# Patient Record
Sex: Male | Born: 1935 | Race: White | Hispanic: No | Marital: Married
Health system: Southern US, Community
[De-identification: ages and names within clinical notes are randomized; demographics above are authoritative.]

---

## 1997-08-10 ENCOUNTER — Emergency Department (HOSPITAL_COMMUNITY): Admission: EM | Admit: 1997-08-10 | Discharge: 1997-08-10 | Payer: Self-pay | Admitting: Emergency Medicine

## 1997-09-01 ENCOUNTER — Ambulatory Visit (HOSPITAL_COMMUNITY): Admission: RE | Admit: 1997-09-01 | Discharge: 1997-09-01 | Payer: Self-pay | Admitting: Pulmonary Disease

## 1997-09-28 ENCOUNTER — Ambulatory Visit (HOSPITAL_COMMUNITY): Admission: RE | Admit: 1997-09-28 | Discharge: 1997-09-28 | Payer: Self-pay | Admitting: Internal Medicine

## 1997-11-09 ENCOUNTER — Inpatient Hospital Stay (HOSPITAL_COMMUNITY): Admission: EM | Admit: 1997-11-09 | Discharge: 1997-11-19 | Payer: Self-pay | Admitting: Emergency Medicine

## 1997-11-10 ENCOUNTER — Encounter: Payer: Self-pay | Admitting: Emergency Medicine

## 1997-11-11 ENCOUNTER — Encounter: Payer: Self-pay | Admitting: Emergency Medicine

## 1997-11-15 ENCOUNTER — Encounter: Payer: Self-pay | Admitting: Emergency Medicine

## 1997-11-22 ENCOUNTER — Inpatient Hospital Stay (HOSPITAL_COMMUNITY): Admission: EM | Admit: 1997-11-22 | Discharge: 1997-11-24 | Payer: Self-pay | Admitting: Emergency Medicine

## 1997-11-30 ENCOUNTER — Encounter: Admission: RE | Admit: 1997-11-30 | Discharge: 1998-02-28 | Payer: Self-pay | Admitting: Radiation Oncology

## 1998-01-30 ENCOUNTER — Encounter: Payer: Self-pay | Admitting: Hematology & Oncology

## 1998-01-30 ENCOUNTER — Ambulatory Visit (HOSPITAL_COMMUNITY): Admission: RE | Admit: 1998-01-30 | Discharge: 1998-01-30 | Payer: Self-pay | Admitting: Hematology & Oncology

## 1998-02-20 ENCOUNTER — Encounter: Payer: Self-pay | Admitting: Thoracic Surgery

## 1998-02-20 ENCOUNTER — Inpatient Hospital Stay: Admission: RE | Admit: 1998-02-20 | Discharge: 1998-02-28 | Payer: Self-pay | Admitting: Thoracic Surgery

## 1998-02-21 ENCOUNTER — Encounter: Payer: Self-pay | Admitting: Thoracic Surgery

## 1998-02-22 ENCOUNTER — Encounter: Payer: Self-pay | Admitting: Thoracic Surgery

## 1998-02-23 ENCOUNTER — Encounter: Payer: Self-pay | Admitting: Thoracic Surgery

## 1998-02-24 ENCOUNTER — Encounter: Payer: Self-pay | Admitting: Thoracic Surgery

## 1998-02-25 ENCOUNTER — Encounter: Payer: Self-pay | Admitting: Thoracic Surgery

## 1998-02-26 ENCOUNTER — Encounter: Payer: Self-pay | Admitting: Thoracic Surgery

## 1998-02-27 ENCOUNTER — Encounter: Payer: Self-pay | Admitting: Thoracic Surgery

## 1998-05-02 ENCOUNTER — Encounter: Payer: Self-pay | Admitting: Thoracic Surgery

## 1998-05-04 ENCOUNTER — Ambulatory Visit (HOSPITAL_COMMUNITY): Admission: RE | Admit: 1998-05-04 | Discharge: 1998-05-04 | Payer: Self-pay | Admitting: Thoracic Surgery

## 1998-07-03 ENCOUNTER — Encounter: Admission: RE | Admit: 1998-07-03 | Discharge: 1998-10-01 | Payer: Self-pay | Admitting: Anesthesiology

## 1998-10-18 ENCOUNTER — Ambulatory Visit (HOSPITAL_COMMUNITY): Admission: RE | Admit: 1998-10-18 | Discharge: 1998-10-18 | Payer: Self-pay

## 1998-12-01 ENCOUNTER — Encounter: Admission: RE | Admit: 1998-12-01 | Discharge: 1999-02-27 | Payer: Self-pay | Admitting: Anesthesiology

## 1999-02-27 ENCOUNTER — Encounter: Admission: RE | Admit: 1999-02-27 | Discharge: 1999-05-23 | Payer: Self-pay | Admitting: Anesthesiology

## 1999-08-21 ENCOUNTER — Encounter: Admission: RE | Admit: 1999-08-21 | Discharge: 1999-11-19 | Payer: Self-pay | Admitting: Anesthesiology

## 1999-11-21 ENCOUNTER — Encounter: Admission: RE | Admit: 1999-11-21 | Discharge: 2000-02-19 | Payer: Self-pay | Admitting: Anesthesiology

## 2000-04-16 ENCOUNTER — Ambulatory Visit (HOSPITAL_COMMUNITY): Admission: RE | Admit: 2000-04-16 | Discharge: 2000-04-16 | Payer: Self-pay

## 2000-05-03 ENCOUNTER — Ambulatory Visit (HOSPITAL_COMMUNITY): Admission: RE | Admit: 2000-05-03 | Discharge: 2000-05-03 | Payer: Self-pay

## 2000-05-08 ENCOUNTER — Encounter: Payer: Self-pay | Admitting: Thoracic Surgery

## 2000-05-08 ENCOUNTER — Inpatient Hospital Stay (HOSPITAL_COMMUNITY): Admission: EM | Admit: 2000-05-08 | Discharge: 2000-05-13 | Payer: Self-pay | Admitting: Emergency Medicine

## 2000-05-12 ENCOUNTER — Encounter: Payer: Self-pay | Admitting: Pulmonary Disease

## 2000-05-13 ENCOUNTER — Inpatient Hospital Stay: Admission: RE | Admit: 2000-05-13 | Discharge: 2000-05-16 | Payer: Self-pay | Admitting: Pulmonary Disease

## 2001-09-14 ENCOUNTER — Ambulatory Visit (HOSPITAL_COMMUNITY): Admission: RE | Admit: 2001-09-14 | Discharge: 2001-09-14 | Payer: Self-pay

## 2001-09-21 ENCOUNTER — Ambulatory Visit (HOSPITAL_COMMUNITY): Admission: RE | Admit: 2001-09-21 | Discharge: 2001-09-21 | Payer: Self-pay

## 2001-10-05 ENCOUNTER — Ambulatory Visit (HOSPITAL_COMMUNITY): Admission: RE | Admit: 2001-10-05 | Discharge: 2001-10-05 | Payer: Self-pay

## 2002-03-14 ENCOUNTER — Encounter: Payer: Self-pay | Admitting: Emergency Medicine

## 2002-03-15 ENCOUNTER — Inpatient Hospital Stay (HOSPITAL_COMMUNITY): Admission: EM | Admit: 2002-03-15 | Discharge: 2002-03-22 | Payer: Self-pay | Admitting: Emergency Medicine

## 2002-03-17 ENCOUNTER — Encounter: Payer: Self-pay | Admitting: Internal Medicine

## 2002-03-18 ENCOUNTER — Encounter: Payer: Self-pay | Admitting: Internal Medicine

## 2002-03-21 ENCOUNTER — Encounter: Payer: Self-pay | Admitting: Internal Medicine

## 2009-02-22 ENCOUNTER — Ambulatory Visit: Payer: Self-pay | Admitting: Vascular Surgery

## 2010-07-31 NOTE — Procedures (Signed)
CAROTID DUPLEX EXAM   INDICATION:  Right CCA/innominate stenosis.   HISTORY:  Diabetes:  Borderline.  Cardiac:  No.  Hypertension:  No.  Smoking:  Yes.  Previous Surgery:  No.  CV History:  Asymptomatic.  Amaurosis Fugax No, Paresthesias No, Hemiparesis No.                                       RIGHT             LEFT  Brachial systolic pressure:         60                70  Brachial Doppler waveforms:         Monophasic        Monophasic  Vertebral direction of flow:        Not visualized    Antegrade  DUPLEX VELOCITIES (cm/sec)  CCA peak systolic                   P = 195, M = 47   P = 250, M = 116  ECA peak systolic                   51                143  ICA peak systolic                   46                102  ICA end diastolic                   32                34  PLAQUE MORPHOLOGY:  PLAQUE AMOUNT:  PLAQUE LOCATION:   IMPRESSION:  1. Bilateral internal carotid arteries appear within normal limits      with no plaque visualized within them.  Right velocities appear      abnormally low.  2. Elevated velocities in the bilateral common carotid artery origins.  3. Elevated velocities of 379 cm/s in the innominate artery on the      right.  4. Right vertebral artery not visualized.  Left is antegrade.  5. Bilateral monophasic brachial Dopplers with low pressures.   ___________________________________________  Larina Earthly, M.D.   AS/MEDQ  D:  02/22/2009  T:  02/22/2009  Job:  [id number]

## 2010-07-31 NOTE — Consult Note (Signed)
NEW PATIENT CONSULTATION   Adam Figueroa, Adam Figueroa  DOB:  December 10, 1932                                       02/22/2009  ZOXWR#:[id number]   The patient presents today for evaluation of extracranial  cerebrovascular occlusive disease.  He is a very pleasant 77 year old  gentleman who was evaluated with an outside ultrasound for low blood  pressure in both arms.  This suggested potential innominate stenosis and  we are seeing him for further evaluation.  The patient denies any  neurologic deficits.  He specifically denies any history of amaurosis  fugax, transient ischemic attack or stroke.  He denies any posterior  symptoms, specifically any dizziness or blackouts.  He does have a past  history of smoking and actually underwent a resection of a malignancy  lung cancer by Dr. Dewayne Shorter 11 years ago.  He has been cancer free  since that time.  He also has significant spinal stenosis.  He does  report positional numbness in his fourth and fifth fingers bilaterally  that occur with certain positions.  He does have history of prior  thoracic spine surgery as well.  He denies hypertension, elevated  cholesterol.  He has never had any cardiac difficulty and denies COPD or  emphysema.   FAMILY HISTORY:  Is negative for premature atherosclerotic disease.   SOCIAL HISTORY:  He is married with 1 child.  He is retired.  He does  continue to smoke less than 1 pack of cigarettes per day.  He does not  drink alcohol on a regular basis.   REVIEW OF SYSTEMS:  He denies any weight loss or weight gain.  His  weight is reported at 148 pounds.  He is 5 feet 9 inches tall.  PULMONARY:  He does have shortness of breath with exertion.  He is on  home oxygen from a pulmonary standpoint.  CARDIAC:  He denies any other cardiac difficulties.  GI:  Positive for reflux.  GU:  Negative for urinary frequency.  VASCULAR:  Negative for claudication type symptoms.  NEUROLOGIC:  He denies dizziness,  blackouts, headaches, seizure.  MUSCULOSKELETAL:  Positive for arthritis.  HEENT:  Positive for occasional sore throat.  PSYCHIATRIC:  Negative.  HEMATOLOGIC:  Negative.  SKIN:  Negative.   PHYSICAL EXAM:  General:  He is a well-developed, thin white male  appearing his stated age.  He is in no distress.  Vital signs:  His  blood pressure is systolic at 60 in the left arm, not obtainable in the  right arm.  His heart rate is 60, respirations 16.  HEENT:  Pupils  equal, round and reactive to light.  Extraocular movements are intact.  Neck:  No cervical lymphadenopathy.  No bruits are present.  Lungs:  Are  clear bilaterally.  Does have a well-healed left thoracotomy incision.  Cardiovascular:  Heart is regular rate and rhythm.  Abdomen:  Soft,  nontender.  I do not hear any bruits.  He does not have any masses  present.  Musculoskeletal:  No deformities or cyanosis.  Neuro:  No  focal weakness or paresthesias.  Skin:  Without ulcers or rashes.  He  does have barely palpable radial pulses bilaterally.  He has 2+ femoral  pulses, absent popliteal and distal pulses.   He underwent repeat carotid duplex in our office and this does appear to  show evidence of stenosis in his innominate artery.  His right vertebral  artery is not visualized.  He does have antegrade flow in his left  vertebral artery.  His brachial pressures are 60 bilaterally with  monophasic waveforms.  His carotids showed no evidence of elevated  velocities but he does have proximal stenoses.  I discussed this at  length with the patient and his wife present and also by son-in-law by  telephone.  I explained to them that I am not able to find any symptoms  related to his extensive supra-aortic trunk stenoses with his left  subclavian and innominate artery stenoses.  I discussed options of  further evaluation with CT scan or arteriogram but in light of no  symptoms would not recommend any treatment based on these.  I  explained  there is no role for asymptomatic treatment of these areas of stenosis.  I did explain the symptoms of focal neurologic deficits or global  posterior circulation ischemia and he will notify us should this occur.  I did explain that it would be essentially impossible to know what an  accurate blood pressure is.  I certainly would not recommend any  invasive treatment simply to be able to obtain blood pressure  measurements.  They understand and will notify us should he develop any  new difficulties.  We will see him again on an as-needed basis.   Larina Earthly, M.D.  Electronically Signed   TFE/MEDQ  D:  02/22/2009  T:  02/23/2009  Job:  [id number]   cc:   Kari Baars, M.D.

## 2010-08-03 NOTE — Discharge Summary (Signed)
NAME:  Adam Figueroa, Adam Figueroa                        ACCOUNT NO.:  [id number]   MEDICAL RECORD NO.:  [id number]                   PATIENT TYPE:  INP   LOCATION:  4713                                 FACILITY:  [facility]   PHYSICIAN:  Catalina Pizza, M.D.                     DATE OF BIRTH:  1932/08/24   DATE OF ADMISSION:  03/14/2002  DATE OF DISCHARGE:  03/22/2002                                 DISCHARGE SUMMARY   DISCHARGE DIAGNOSES:  1. Left lower lobe pneumonia.  2. Depression.  3. Chronic pain.  4. Chronic obstructive pulmonary disease.  5. Oral candidiasis.  6. Hypokalemia.   DISCHARGE MEDICATIONS:  1. Neurontin 300 mg p.o. b.i.d.  2. Zoloft 100 mg p.o. daily.  3. Tequin 400 mg p.o. daily x8 days.  4. Prednisone 60 mg for three days, 40 mg for three days, 20 mg for three     days.  5. Clotrimazole Troche 4-5 times daily for 14 days.  6. Tussionex 5 mL q.12h. p.r.n. for cough.  7. Albuterol MDI 2 puffs q.6h. p.r.n. shortness of breath.  8. Atrovent MDI 2 puffs b.i.d. x2 weeks.  9. Humibid LA 1200 mg p.o. b.i.d. x2 weeks.   CHIEF COMPLAINT:  Shortness of breath.   HISTORY OF PRESENT ILLNESS:  A 77 year old white male with history of left  upper lobe lung resection secondary to non-small cell cancer with history of  COPD, former smoker, presented to the emergency department with one-week  history of cough with sputum, yellow to greenish to brownish.  Positive for  myalgias, fevers, decreased appetite, rhinorrhea, and sinus tenderness.  Symptoms have gradually worsened up until day of admission with increased  shortness of breath.  The patient denies any chest pain but admits to muscle  soreness in the abdomen and chest and episodic vomiting following cough  spells.   PHYSICAL EXAMINATION:  VITAL SIGNS:  Upon admission:  Blood pressure 106/52,  pulse 106, temperature 97, respirations 25, O2 saturation 87% on room air  and 93% on 2 L.  GENERAL:  This is a male in mild distress,  unable to complete sentences  with complete breath.  HEENT:  Eyes:  Pupils equal, round, and reactive to light and accommodation.  Oropharynx was red and erythematous.  No exudates.  Positive sinus  tenderness.  NECK:  Supple, no JVD, no lymphadenopathy.  RESPIRATIONS:  Decreased breath sounds on the left, positive coarse breath  sounds bibasilar, positive crackles anteriorly left lung field and  tachypnea.  CARDIOVASCULAR:  Tachycardic but regular rhythm.  GASTROINTESTINAL:  Soft and nondistended.  Positive diffuse tenderness.  No  guarding.  Positive bowel sounds.  No masses appreciated.  EXTREMITIES:  No edema, cyanosis, or clubbing.  NEUROLOGIC:  Cranial nerves II-XII intact.  There were no focal deficits.   LABORATORY DATA:  Labs obtained during hospitalization:  ABG on 3 L had a pH  of  7.33, a PCO2 of 40.6, PO2 of 69, bicarb 22, O2 saturation of 92.  CBC  upon discharge:  White blood cells 13.1, hemoglobin 14.5, hematocrit 42.7,  MCV of 84.2, platelets of 332.  A BMP upon discharge of sodium 139,  potassium 3.9, chloride 102, CO2 26, glucose 134, BUN 14, creatinine 0.8,  calcium 9.  AST of 36, ALT of 28, alkaline phosphatase 75, total bilirubin  1.3.  Blood cultures x2 were negative.  Sputum culture:  Moderate yeast  noted.  C. difficile toxin was negative.  Legionella was negative.  Influenza type A and influenza type B negative.   Studies obtained during hospitalization:  Chest x-ray upon admission showed  extensive scarring, a change in the left lung, but no new increased density  in the left lower lung but suspicious for infiltrate.   HOSPITAL COURSE:  Problem 1:  COMMUNITY-ACQUIRED PNEUMONIA:  The patient  apparently had left lower lobe pneumonia upon admission and was started on  Zithromax 500 mg and Rocephin 1 g per day and received this for four days  until switched to Tequin 400 mg p.o. on March 20, 2002.  During his  hospitalization, the patient continued to have  troubles with his breathing  secondary to his COPD and previous history.  Consulted Dr. Shan Levans,  who suggested to continue on antibiotics but as well as treat COPD with IV  steroids and inhalers and nebulizer treatment.  Upon discharge, the patient  was to receive a 10-day total course of Tequin 400 mg daily.   Problem 2:  CHRONIC OBSTRUCTIVE PULMONARY DISEASE:  During hospitalization,  the patient was treated with nebulizer treatment, as well as p.o. and IV  steroids and sent out on a prednisone taper over nine days; 60 mg, 40 mg, 20  mg three days each.  Also sent out with albuterol and Atrovent inhalers, as  well as Humibid 1200 mg b.i.d.  Also control the cough with Tussionex.   Problem 3:  ORAL CANDIDIASIS:  The patient was started on clotrimazole  troches 4-5 times per day for 14 days secondary to continual nebulizer  treatment and steroids.   Problem 4:  DEPRESSION:  The patient is to continue on Zoloft 100 mg p.o.  daily.   Problem 5:  CHRONIC PAIN:  The patient is sent out on Neurontin 300 mg p.o.  b.i.d., which was his at home dose.   DISPOSITION:  The patient has no restrictions.  He is to return to the  emergency department if he has increase in fever greater than 101.3 or  having worsening breathing.  He is to follow up with his family doctor if he  has any worsening problems with his breathing.  The patient is encouraged to  get a pulmonary doctor to follow, given his significant lung history.  He is  set up to get home O2 at 2 L for the next three weeks as needed for his  shortness of breath and he is to follow up with his family doctor for  further evaluation and need for home oxygen.                                               Catalina Pizza, M.D.    ZH/MEDQ  D:  05/12/2002  T:  05/13/2002  Job:  [id number]

## 2010-08-03 NOTE — Discharge Summary (Signed)
[place]  Patient:    Adam Figueroa, Adam Figueroa                     MRN: [id number] Adm. Date:  [id number] Disc. Date: 05/13/00 Attending:  Merwyn Katos Dictator:   Earley Favor, RN, MSN, ACNP CC:         Norton Blizzard, M.D.  Loma Sender, M.D.   Discharge Summary  DATE OF BIRTH:  08/12/1932  DISCHARGE DIAGNOSES: 1. Community acquired pneumonia, no organ specified. 2. 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 3. Depression. 4. Failure to thrive. 5. Debilitation.  HISTORY OF PRESENT ILLNESS:  Mr. Mccleery is an elderly white male patient of Dr. Edwyna Shell at [facility] who performed a left upper lobe wedge resection for a hilar mass nonsmall cell cancer with radiation therapy and chemotherapy December 1999.  He is followed by Dr. Sung Amabile of pulmonary. ______ to [facility] in the emergency room with a three week history of weight loss, cough that was nonproductive, dry heaves, postnasal drip, increasing shortness of breath that was over his normal pulmonary baseline. Recent chest CT ordered by Dr. Edwyna Shell demonstrated a left upper lobe questionable pneumonia and changes questionably consistent with radiation pneumonitis.  Due to ataxia he was admitted for further evaluation and treatment along with a weight loss of over 20 pounds over the last three weeks.  LABORATORIES:  CEA 1.0.  TSH 1.339.  Sodium 138, potassium 4.2, chloride 104, CO2 27, glucose 143, BUN 8, creatinine 0.8, calcium 8.4.  Hemoglobin 12.7, hematocrit 38.3, WBC 14.1, platelets 421,000.  PT 15.7, INR 1.4, PTT 38.  ALT 44, ALP 47, total bilirubin 0.7.  Chest x-ray shows postoperative changes, slight improvement of aeration left mid and upper lung zones.  HOSPITAL COURSE: #1 - COMMUNITY ACQUIRED PNEUMONIA, NO ORGAN SPECIFIED:  Mr. Fussell is currently on day five antimicrobial therapy.  He  will be continued on p.o. Tequin for 10 full days for treatment.  No organ was specified with this pneumonia.  Radiographic changes were consistent with left upper lobe pneumonia.  #2 - STATUS POST RESECTION OF LEFT UPPER LOBE WEDGE RESECTION FOR NONSMALL CELL CARCINOMA:  No changes in that area.  #3 - DEPRESSION:  Currently depression is being treated with Zoloft.  He has had a weight loss of 20 pounds over the last three weeks.  Family reports that he is not motivated to perform activities of daily living.  Due to his depression he will be transferred to subacute care unit for further evaluation and treatment.  #4 - FAILURE TO THRIVE, DEBILITATION:  Due to his recent weight loss over the last three weeks of 20 pounds coupled with his new no organ specific community acquired pneumonia coupled again with his depression, he will be transferred to subacute care unit for strengthening, rehabilitation, and continued medication to treat his depression.  MEDICATIONS:  1. Albuterol 2.5 hand-held nebulizer q.6h.  2. Atrovent 0.5 mg hand-held nebulizer q.6h.  3. Zoloft 25 mg q.d.  4. Protonix 40 mg q.d.  5. Neurontin 300 mg b.i.d.  6. Tequin 400 mg q.d. for five days.  7. Ambien 10 mg q.h.s. p.r.n. dispensed as written.  8. Laxative of choice.  9. Tylenol 650 mg q.6h. p.r.n. for discomfort. 10. Sodium chloride nasal spray two puffs q.i.d. p.r.n. for nasal dryness.  DIET:  Regular diet with chocolate Ensure.  Of note, he  refused barium swallow study.  CONDITION ON DISCHARGE:  His community acquired pneumonia has responded to antimicrobial interventions.  His depression remains profound with a dull affect.  Failure to thrive and debilitation secondary to acute and chronic illness being address will transfer to subacute care unit.  His pulmonary status improved.  His depression and debilitation remained worse. DD:  05/13/00 TD:  05/13/00 Job: [id number]

## 2010-08-03 NOTE — Consult Note (Signed)
[facility]  Patient:    Adam Figueroa, Adam Figueroa                     MRN: [id number] Adm. Date:  [id number] Attending:  Thyra Breed CC:         Loma Sender, M.D.   Consultation Report  FOLLOWUP EVALUATION:  The patient comes in for followup evaluation of his brachioplexopathy.  Since his previous evaluation, the patient has been relatively stable on his current regimen.  He has minimal pain at 2/10 today. He does have difficulties with sleep; he sleeps for about two hours every night and then cannot get back to sleep.  CURRENT MEDICATIONS 1. Neurontin 400 mg b.i.d. 2. Methadone 5 mg per day. 3. Atrovent. 4. Flovent. 5. Flonase.  EXAMINATION  VITAL SIGNS:  Blood pressure is 139/54.  Heart rate is 67.  Respiratory rate is 16.  O2 saturation is 95%.  Pain level is 2/10.  CHEST:  Lungs demonstrate rare wheezes.  He has some tenderness over the distal aspect of his left thoracotomy scar.  IMPRESSION 1. Brachioplexopathy -- improved. 2. Underlying chronic obstructive pulmonary disease, per Dr. Loma Sender.  DISPOSITION 1. Reduce methadone to 2.5 mg per day if tolerated; if not, go back to 5 mg    per day. 2. Continue on Neurontin 400 mg b.i.d., #60 with 5 refills. 3. Trial of Elavil 10 mg one p.o. q.h.s., #30 with 5 refills. 4. Follow up with me in three months. DD:  11/21/99 TD:  11/21/99 Job: [id number]

## 2010-08-03 NOTE — Consult Note (Signed)
NAME:  Adam Figueroa, Adam Figueroa NO.:  [id number]   MEDICAL RECORD NO.:  [id number]                   PATIENT TYPE:  INP   LOCATION:  4713                                 FACILITY:  [facility]   PHYSICIAN:  Shan Levans, M.D. [facility]            DATE OF BIRTH:  Oct 19, 1932   DATE OF CONSULTATION:  03/19/2002  DATE OF DISCHARGE:                                   CONSULTATION   CHIEF COMPLAINT:  Hypoxemia.   HISTORY OF PRESENT ILLNESS:  This is a 77 year old white male, history of  previous left upper lobectomy for non-small-cell carcinoma of the lung in  1999.  He had received preop radiation therapy and chemotherapy for this  procedure, also has severe COPD, is admitted now on March 15, 2002 to  Medical Teaching Service with hypoxemia and left lung pneumonia.  The x-ray  has shown progression to the right lung; he is on Rocephin and Zithromax.  The patient's symptomatology has waxed and waned, he is still remaining  hypoxic, having difficulty raising secretions.   PAST MEDICAL HISTORY:  Medical:  History of non-small-cell carcinoma of the  lung with resection of the left upper lobe in 1999, history of chronic  obstructive pulmonary disease, history of depression.   ALLERGIES:  None.   SOCIAL HISTORY:  The patient is an ex-smoker; he claims he is not actively  smoking at this time.   SOCIAL HISTORY:  The patient is married.   FAMILY HISTORY:  Noncontributory.   PHYSICAL EXAMINATION:  VITAL SIGNS:  Temperature 98, blood pressure 132/66,  pulse 89, saturation 90% on 4 L.  GENERAL:  This is an ill-appearing, thin, cachectic white male in no acute  distress.  CHEST:  Chest showed distant breath sounds, bilateral rhonchi, expiratory  wheezes.  CARDIAC:  Exam showed a regular rate and rhythm without S3, normal S1 and  S2.  ABDOMEN:  Abdomen was scaphoid, nontender, bowel sounds active.  EXTREMITIES:  Extremities showed no edema or clubbing.  NEUROLOGIC:  Exam  was intact.  HEENT/NECK:  Exam shows no jugular venous distention, no lymphadenopathy,  oropharynx clear, neck supple.   LABORATORY DATA:  Chest x-ray shows patchy infiltrate, right upper lung zone  and left lower lobe.   Sputum culture is no growth to date.  Legionella urine antigen is negative.  Blood cultures are negative to date.  Sodium 142, potassium 3.3, chloride 106, CO2 28, BUN 9, creatinine 0.6,  blood sugar 150.  White count 16.5, hemoglobin 13.0.   IMPRESSION AND RECOMMENDATION:  The patient appears to have community-  acquired pneumonia, both left lower lobe and right upper lobe, slow to  respond to Rocephin and Zithromax, also has severe chronic obstructive  pulmonary disease, on prednisone, nebulizer and oxygen therapy, previous  left upper lobectomy for lung cancer in 1999 with preoperative radiation  therapy and probably some residual lung injury to the left lower lobe due to  radiation  therapy, hypoxemia, increased work of breathing and mucus plugging  and insomnia.   Recommendations:  Would change back to intravenous Solu-Medrol 80 mg  intravenously q.12h. for increased airway inflammation, would continue  Rocephin and Zithromax as are, would consider Ambien 10 mg h.s. for sleep  for the insomnia and continue nebulizer treatments and add Humibid LA two  b.i.d. to the patient's care.  We will have Dr. Onalee Hua B. Simonds follow up  the patient as well with you.                                               Shan Levans, M.D. [facility]    PW/MEDQ  D:  03/19/2002  T:  03/20/2002  Job:  [id number]   cc:   Oley Balm. Sung Amabile, M.D. [facility]  [address]  [place]  [place] [address]  Fax: [phone]   Ileana Roup, M.D.  [address], [place] [address]  Fax: [phone]   [place], [place] Francoise Schaumann.D.

## 2010-08-03 NOTE — Consult Note (Signed)
[facility]  Patient:    Adam Figueroa, Adam Figueroa                     MRN: [id number] Proc. Date: 12/25/99 Adm. Date:  [id number] Attending:  Thyra Breed CC:         Loma Sender, M.D.  Rose Phi. Myna Hidalgo, M.D.   Consultation Report  BRIEF FOLLOWUP EVALUATION:  Ziyon comes in for followup evaluation.  He has no pain.  He is taking the Neurontin 400 mg twice a day, methadone 5 mg one-half tablet at night, Atrovent, Flovent and Flonase.  The Elavil I gave him at 10 mg seems a little too strong for him, as he will sleep for quite some time after that.  EXAMINATION  VITAL SIGNS:  The patient is afebrile with temperature of 97.5, heart rate is 89, respiratory rate is 14, O2 saturation is 94%, pain level is 0/10 and blood pressure is 128/66.  IMPRESSION 1. Brachioplexopathy -- stable. 2. Underlying chronic obstructive pulmonary disease, per Dr. Loma Sender.  DISPOSITION 1. Stop methadone. 2. Continue on Neurontin 400 mg one p.o. b.i.d. 3. Take half the dose of Elavil when needed for sleep. 4. I will see the patient back on an as-needed basis at either the patients    request or Dr. Loma Sender request.  The patient plans to see    Dr. Vear Clock later this month and discuss this with him.  We did not    schedule him back for a followup. DD:  12/25/99 TD:  12/25/99 Job: [id number]

## 2010-08-03 NOTE — Discharge Summary (Signed)
[place]  Patient:    Adam Figueroa, Adam Figueroa                     MRN: [id number] Adm. Date:  [id number] Disc. Date: [id number] Attending:  Billy Fischer B                           Discharge Summary  ADMISSION DIAGNOSES:  1. Debilitation, status post pneumonia. 2. Depression. 3. History of left upper lobe resection for non-small cell carcinoma in the    lung.  HISTORY OF PRESENT ILLNESS AND HOSPITAL COURSE:   Please refer to the admission history and physical for this patients initial presentation. Briefly, he was hospitalized on 05/08/00 at [facility] with failure to thrive, depression, and pneumonia.  He was treated with antibiotics, nebulizers, bronchodilators, intravenous fluids, and antidepressant therapy. Was titrated during the hospital course.  His respiratory status improved rapidly, however he remained markedly debilitated, and it was felt that he would benefit from a short stay in the subacute unit at [facility]. Consequently, he was transferred on 05/13/00 for this.  His treatment for pneumonia was completed.  He was started on physical rehabilitation, etc. However, he remained very noncompliant with the recommended therapy and became difficult to manage due to his reluctance to participate in physical therapy. On 05/16/00, he strongly desired discharge to home.  Therefore, arrangements were made for this.  DISCHARGE MEDICATIONS: 1. Neurontin 300 mg q.12h. 2. Zoloft 50 mg q.h.s.  DISCHARGE INSTRUCTIONS:  He was to call his primary doctor, Dr. Vear Clock, to arrange follow up.  I have made myself available as needed for respiratory problems. DD:  06/26/00 TD:  06/26/00 Job: [id number]

## 2010-08-03 NOTE — Consult Note (Signed)
[facility]  Patient:    Adam Figueroa, Adam Figueroa                     MRN: [id number] Proc. Date: 08/22/99 Adm. Date:  [id number] Attending:  Thyra Breed CC:         Rose Phi. Myna Hidalgo, M.D.             Loma Sender, M.D.                          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 Since his previous evaluation, the patient states that he has no pain although at times he has a lancinating discomfort from the distal aspect of his thoracotomy scar. He states that he is doing well overall. He can only mow half of his yard before he gets short of breath but he has not returned back to be evaluated by Dr. Vear Clock with regard to this. He continues to use the Atrovent inhaler p.r.n.  CURRENT MEDICATIONS:  Hydroxyzine 25 mg 1-2 p.o. q. 8h p.r.n., methadone 5 mg q.d., Neurontin 400 mg b.i.d.  PHYSICAL EXAMINATION:  VITAL SIGNS:  Blood pressure 141/68, heart rate 84, respiratory rate 16, O2 saturations 92% and pain level is 0/10 and temperature is 97.5.  The patient demonstrated tenderness on palpation of the distal aspect of his thoracotomy scar. His neurologic exam is unchanged otherwise.  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  DISPOSITION: 1. Continue on methadone 5 mg 1 p.o. q.d. 2. Continue on Neurontin 400 mg b.i.d. 3. Continue with Atrovent but encouraged to go ahead and see Dr. Loma Sender to see whether he needs to be on a different sort of inhaler. 4. The patient was advised that he could stop the hydroxyzine. 5. Followup with me in 3 months. DD:  08/22/99 TD:  08/22/99 Job: [id number]

## 2013-04-05 ENCOUNTER — Other Ambulatory Visit: Payer: Self-pay | Admitting: Emergency Medicine

## 2013-04-13 ENCOUNTER — Other Ambulatory Visit: Payer: Self-pay | Admitting: Emergency Medicine

## 2013-04-14 DIAGNOSIS — I517 Cardiomegaly: Secondary | ICD-10-CM

## 2013-05-16 DEATH — deceased

## 2013-05-31 ENCOUNTER — Telehealth: Payer: Self-pay

## 2013-05-31 NOTE — Telephone Encounter (Signed)
Patient past away @ [facility] per Obituary in [place] [employer] °

## 2013-08-21 IMAGING — CR DG CHEST 2V
1 series · 2 of 2 positions shown · non-contrast
Comparison: none

REASON FOR EXAM: S.O.B  pain
COMMENTS:

[Series 1: w chest pa · 0.14mm/px · 2 of 2 slices shown]
[im 1/2]
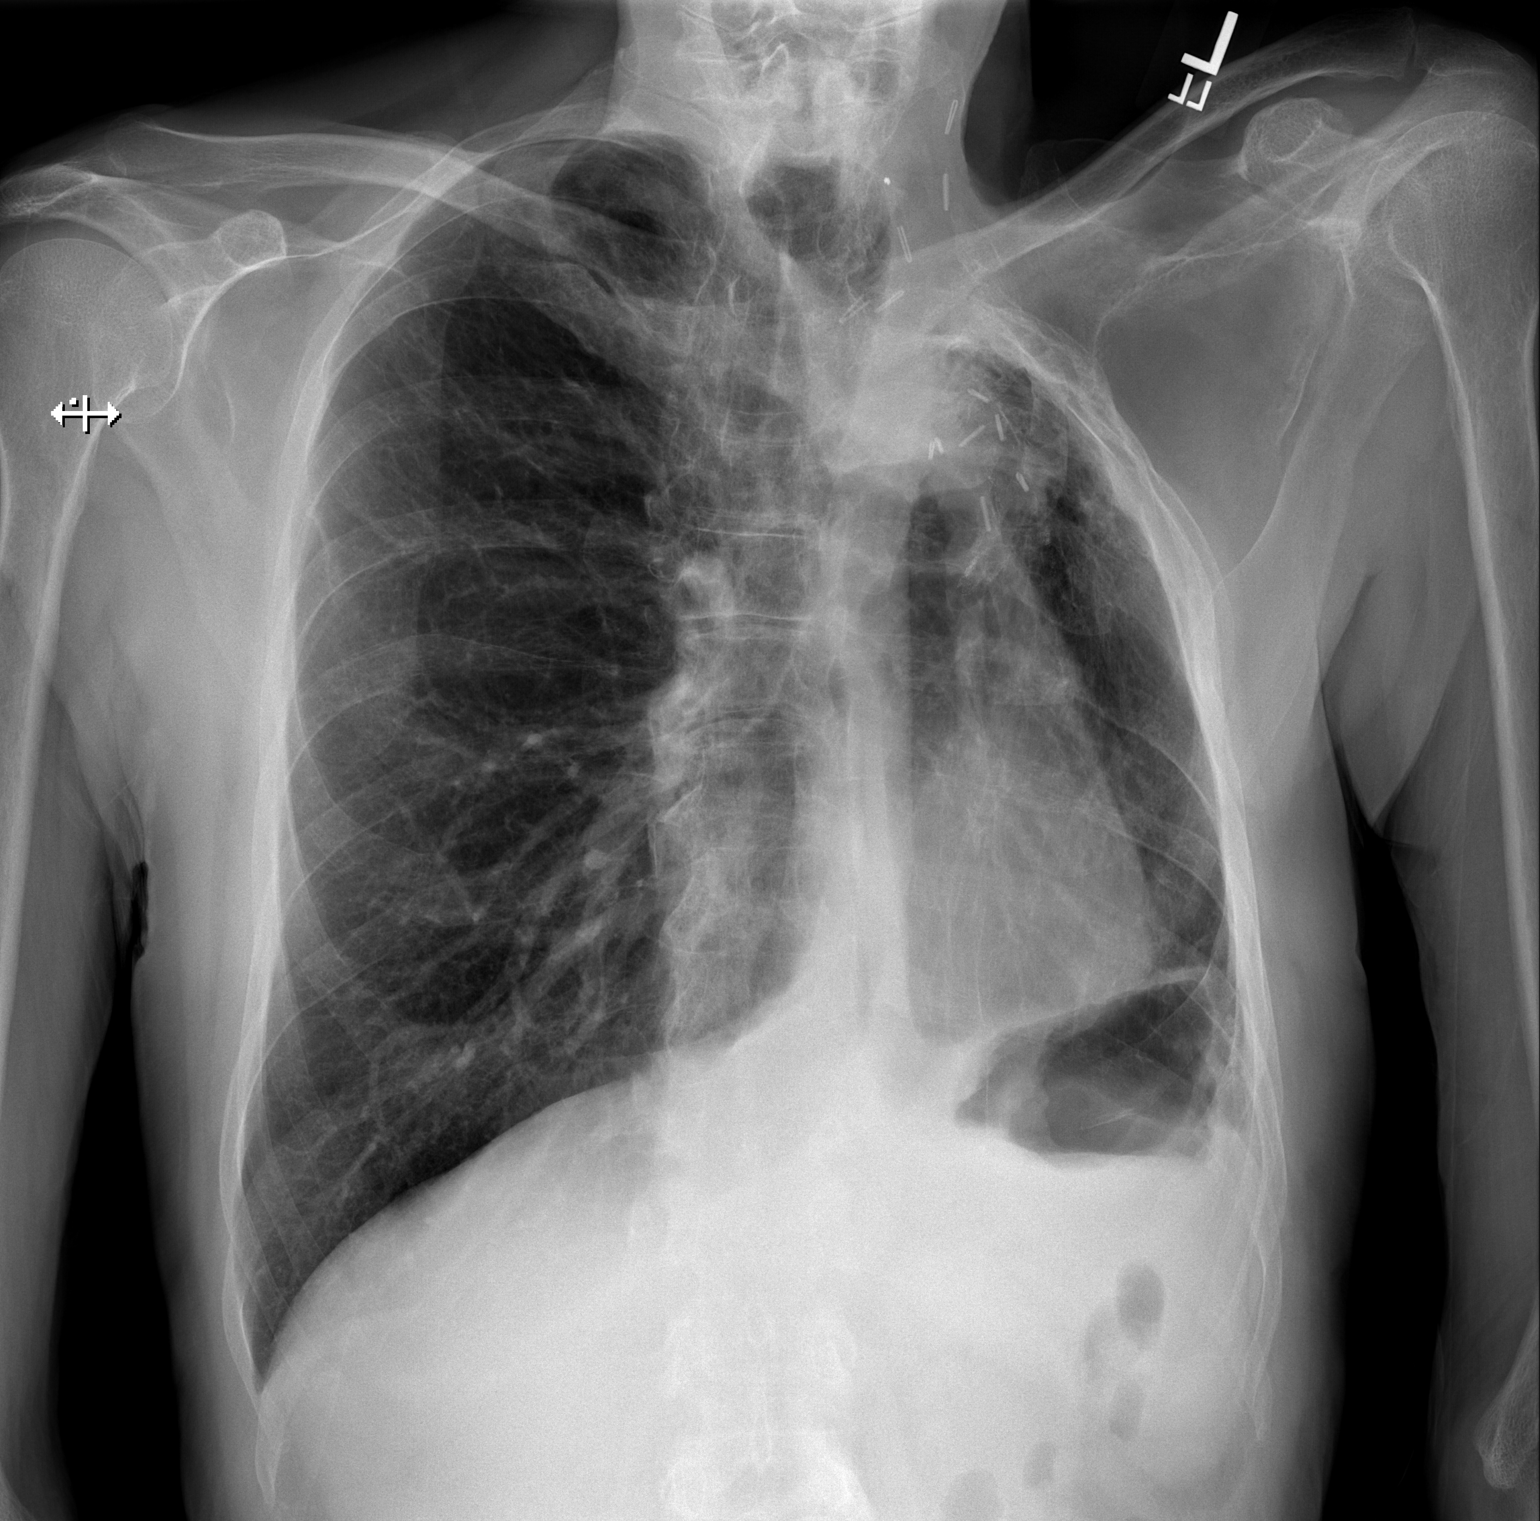
[im 2/2]
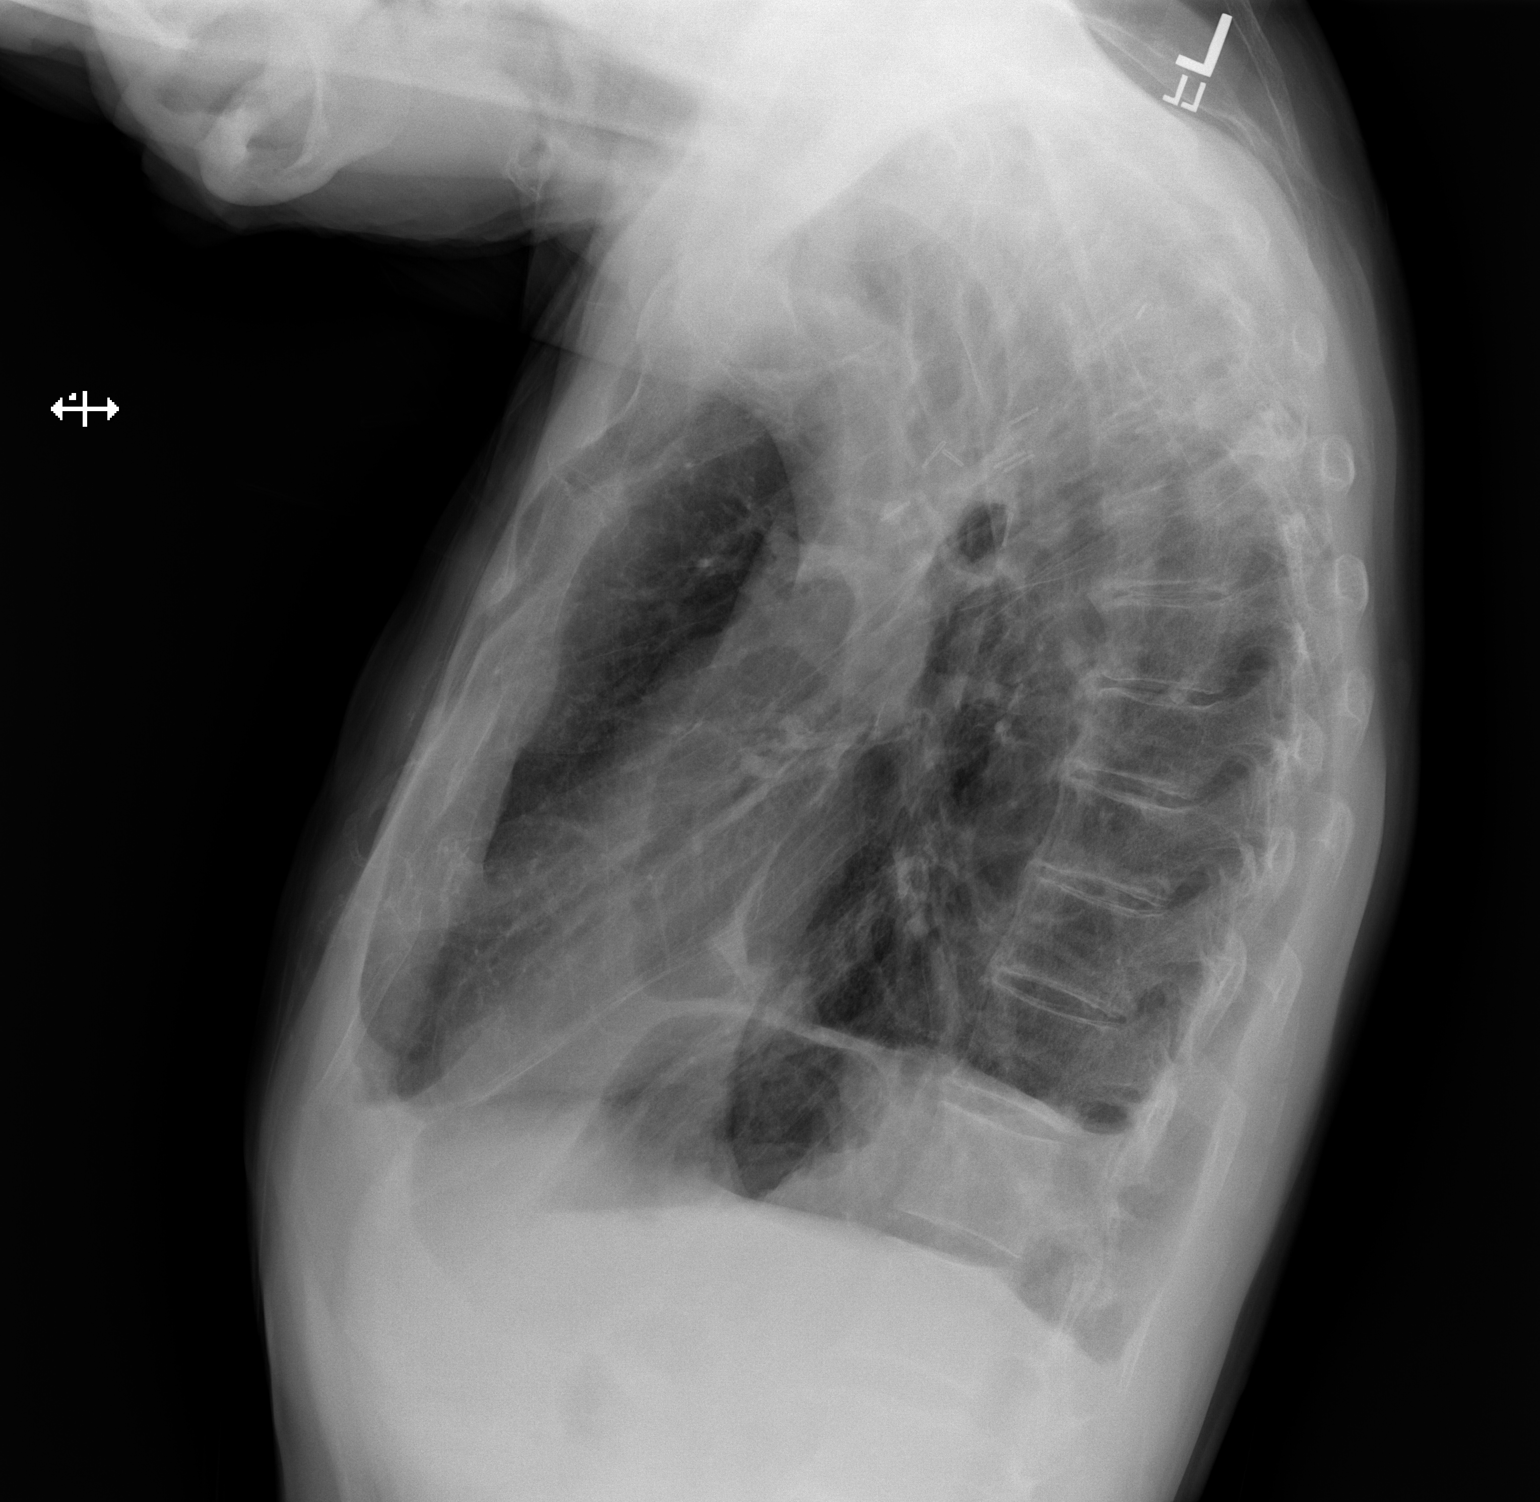

[2 of 2 positions shown; findings below may reference images not displayed]

PROCEDURE:     DXR - DXR CHEST PA (OR AP) AND LATERAL  - January 16, 2012  [DATE]

RESULT:     There is no previous study for comparison. There is increased
density at the left lung apex. A multiple surgical clips are seen in the
base of the neck on the left in the supraclavicular region and of the left
lung apex. There is elevation of the left hilum with volume loss in the left
hemithorax. The heart is normal in size. Emphysematous lung disease is
present area the right lung is hyperinflated. Mediastinal shift toward the
left is present.
IMPRESSION: Presumed malignant/postoperative changes in the left lung
apex. Subsequent volume loss and related changes as described. Correlate
with history. PET CT is available for further assessment if desired.

[REDACTED]

## 2014-11-09 IMAGING — CR DG CHEST 1V PORT
1 series · 1 of 1 positions shown · non-contrast
Comparison: Chest radiograph January 16, 2012

CLINICAL DATA: Shortness of breath, oxygen dependent. Left lung
removed due to cancer in 3444

EXAM:
PORTABLE CHEST - 1 VIEW

[ap]
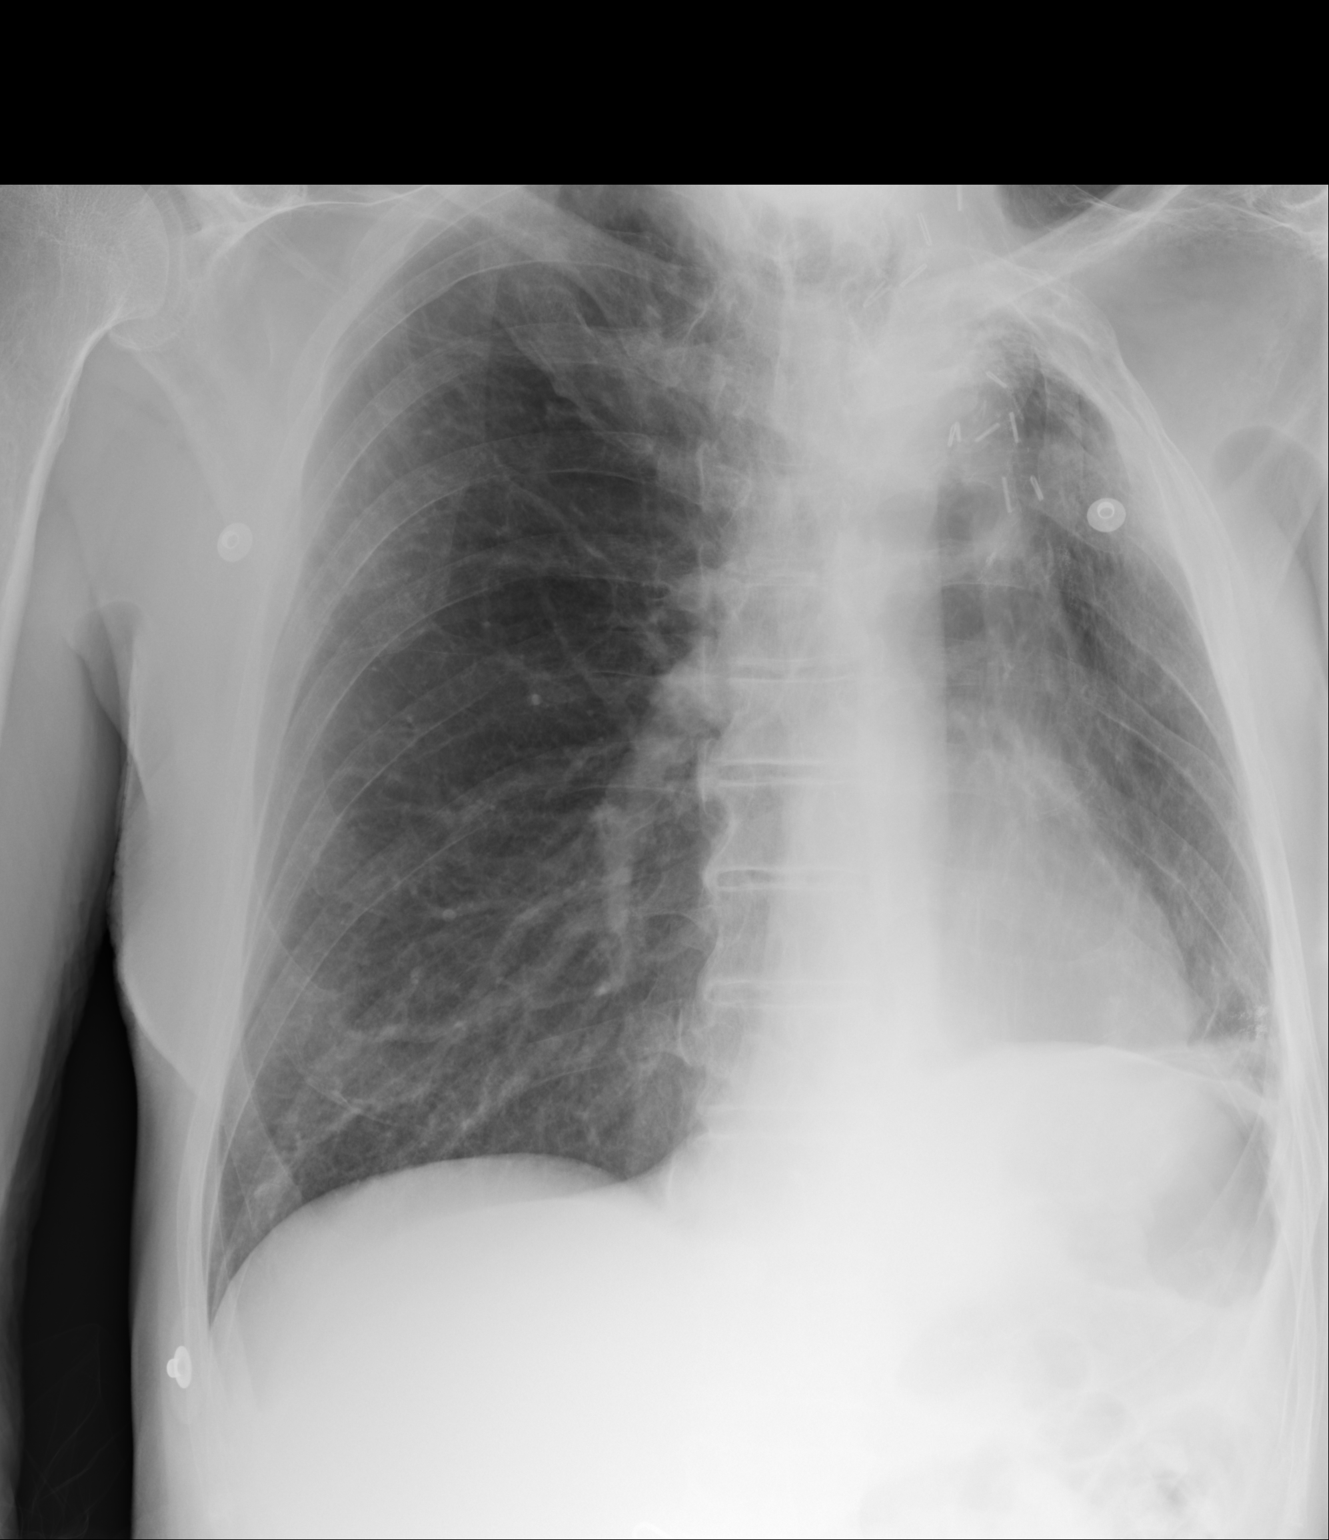

[1 of 1 positions shown; findings below may reference images not displayed]

FINDINGS: Cardiomediastinal silhouette remain shifted to the left, with left
apical left neck surgical clips and left lung volume loss consistent
with partial pneumonectomy. Strandy densities in left lung base.
Calcifications in left lung base. Hyper inflated right lung with
mild chronic interstitial changes. No pneumothorax.

Patient is osteopenic. Soft tissue planes are nonsuspicious. Status
post left apical thoracotomy.
IMPRESSION: Similar postoperative change of the left chest, with strandy
densities in left lung with base which could reflect atelectasis.

  By: Wilmer Blanco Baos
